# Patient Record
Sex: Female | Born: 1970 | Race: Black or African American | Hispanic: No | Marital: Single | Smoking: Current every day smoker
Health system: Southern US, Community
[De-identification: ages and names within clinical notes are randomized; demographics above are authoritative.]

## PROBLEM LIST (undated history)

## (undated) DIAGNOSIS — I10 Essential (primary) hypertension: Secondary | ICD-10-CM

## (undated) DIAGNOSIS — F329 Major depressive disorder, single episode, unspecified: Secondary | ICD-10-CM

## (undated) DIAGNOSIS — F32A Depression, unspecified: Secondary | ICD-10-CM

## (undated) HISTORY — PX: TUBAL LIGATION: SHX77

---

## 2004-06-28 ENCOUNTER — Emergency Department: Payer: Self-pay | Admitting: Emergency Medicine

## 2004-07-02 ENCOUNTER — Inpatient Hospital Stay: Payer: Self-pay | Admitting: Internal Medicine

## 2008-10-25 ENCOUNTER — Inpatient Hospital Stay: Payer: Self-pay | Admitting: Obstetrics and Gynecology

## 2009-05-08 ENCOUNTER — Emergency Department: Payer: Self-pay | Admitting: Unknown Physician Specialty

## 2015-12-16 ENCOUNTER — Emergency Department
Admission: EM | Admit: 2015-12-16 | Discharge: 2015-12-16 | Disposition: A | Discharge status: Home / Self Care | Payer: Self-pay | Attending: Emergency Medicine | Admitting: Emergency Medicine

## 2015-12-16 ENCOUNTER — Encounter: Payer: Self-pay | Admitting: Emergency Medicine

## 2015-12-16 ENCOUNTER — Emergency Department: Payer: Self-pay

## 2015-12-16 DIAGNOSIS — F17210 Nicotine dependence, cigarettes, uncomplicated: Secondary | ICD-10-CM | POA: Insufficient documentation

## 2015-12-16 DIAGNOSIS — R079 Chest pain, unspecified: Secondary | ICD-10-CM | POA: Insufficient documentation

## 2015-12-16 DIAGNOSIS — E119 Type 2 diabetes mellitus without complications: Secondary | ICD-10-CM | POA: Insufficient documentation

## 2015-12-16 HISTORY — DX: Depression, unspecified: F32.A

## 2015-12-16 HISTORY — DX: Major depressive disorder, single episode, unspecified: F32.9

## 2015-12-16 LAB — CBC
HEMATOCRIT: 37.4 % (ref 35.0–47.0)
Hemoglobin: 12.8 g/dL (ref 12.0–16.0)
MCH: 35 pg — ABNORMAL HIGH (ref 26.0–34.0)
MCHC: 34.1 g/dL (ref 32.0–36.0)
MCV: 102.9 fL — AB (ref 80.0–100.0)
Platelets: 187 10*3/uL (ref 150–440)
RBC: 3.64 MIL/uL — ABNORMAL LOW (ref 3.80–5.20)
RDW: 13.9 % (ref 11.5–14.5)
WBC: 7 10*3/uL (ref 3.6–11.0)

## 2015-12-16 LAB — COMPREHENSIVE METABOLIC PANEL
ALBUMIN: 4.4 g/dL (ref 3.5–5.0)
ALT: 14 U/L (ref 14–54)
AST: 31 U/L (ref 15–41)
Alkaline Phosphatase: 70 U/L (ref 38–126)
Anion gap: 9 (ref 5–15)
BUN: 13 mg/dL (ref 6–20)
CO2: 20 mmol/L — ABNORMAL LOW (ref 22–32)
CREATININE: 0.56 mg/dL (ref 0.44–1.00)
Calcium: 9.3 mg/dL (ref 8.9–10.3)
Chloride: 108 mmol/L (ref 101–111)
GFR calc Af Amer: >60 mL/min (ref >60)
GLUCOSE: 116 mg/dL — AB (ref 65–99)
Potassium: 3.7 mmol/L (ref 3.5–5.1)
Sodium: 137 mmol/L (ref 135–145)
TOTAL PROTEIN: 8.4 g/dL — AB (ref 6.5–8.1)

## 2015-12-16 LAB — TROPONIN I: Troponin I: <0.03 ng/mL (ref <0.03)

## 2015-12-16 NOTE — ED Notes (Signed)
Pt verbalized understanding of discharge instructions. NAD at this time. 
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
## 2015-12-16 NOTE — ED Provider Notes (Signed)
I accepted care from Dr. Cyril LoosenKinner. Patient presented with atypical chest discomfort, and initial workup and evaluation reassuring. Sign out follow-up repeat troponin.  8:30, I reviewed the second negative troponin. I discussed with patient and discussed return precautions and follow-up plan. Follow-up to include calling cardiology office for appointment in the next 1-2 days. Also follow up with primary care doctor within 1 week.     Governor Rooksebecca Aubreigh Fuerte, MD 12/16/15 [phone]
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
## 2015-12-16 NOTE — Discharge Instructions (Signed)
You were evaluated for chest discomfort, although no certain cause was found, your exam and evaluation are reassuring in the emergency department today.  Return to the emergency department for any worsening chest pain, fever, nausea, sweats, weakness, numbness, dizziness or passing out, altered mental status, or any other symptoms concerning to you.  I recommend he follow up with your primary doctor in 1 week. I recommend you follow up with a cardiologist, call the office number provided tomorrow for an appointment within the next 1-2 days for further evaluation.

## 2016-02-27 ENCOUNTER — Emergency Department
Admission: EM | Admit: 2016-02-27 | Discharge: 2016-02-27 | Disposition: A | Discharge status: Home / Self Care | Payer: Self-pay | Attending: Emergency Medicine | Admitting: Emergency Medicine

## 2016-02-27 ENCOUNTER — Emergency Department: Payer: Self-pay

## 2016-02-27 DIAGNOSIS — R609 Edema, unspecified: Secondary | ICD-10-CM

## 2016-02-27 DIAGNOSIS — M25569 Pain in unspecified knee: Secondary | ICD-10-CM

## 2016-02-27 DIAGNOSIS — M1712 Unilateral primary osteoarthritis, left knee: Secondary | ICD-10-CM | POA: Insufficient documentation

## 2016-02-27 DIAGNOSIS — F17210 Nicotine dependence, cigarettes, uncomplicated: Secondary | ICD-10-CM | POA: Insufficient documentation

## 2016-02-27 DIAGNOSIS — M25462 Effusion, left knee: Secondary | ICD-10-CM

## 2016-02-27 MED ORDER — HYDROCODONE-ACETAMINOPHEN 5-325 MG PO TABS
1.0000 | ORAL_TABLET | ORAL | Status: AC
  Administered 2016-02-27: 1 via ORAL
  Filled 2016-02-27: qty 1

## 2016-02-27 MED ORDER — LIDOCAINE HCL (PF) 1 % IJ SOLN
5.0000 mL | Freq: Once | INTRAMUSCULAR | Status: AC
  Administered 2016-02-27: 5 mL
  Filled 2016-02-27: qty 5

## 2016-02-27 NOTE — ED Provider Notes (Signed)
[facility]-EMERGENCY DEPARTMENT Provider Note   CSN: [id number] Arrival date & time: 02/27/16  1617     History   Chief Complaint Chief Complaint  Patient presents with  . Knee Pain    HPI Adriana Meza is a 46 y.o. female presents to the emergency department for evaluation of left knee pain and swelling. She has had tightness and swelling throughout the left knee for the last few days. She denies any trauma or injury. She's had knee pain off and on for the last 5 years. No trauma or injury. She has not had any treatment for her knee pain, states she has had x-rays showing osteoarthritis in the past. Her knee will usually take at nighttime and with cold rainy weather. Her last few days she's had significant tightness and swelling throughout the left knee that has limited her ability to walk. Pain is severe. She has not taken any medications for pain. She denies any coughing, chest pain, shortness of breath, edema or redness throughout the lower extremity.  HPI  Past Medical History:  Diagnosis Date  . Depression     There are no active problems to display for this patient.   Past Surgical History:  Procedure Laterality Date  . CESAREAN SECTION    . TUBAL LIGATION      OB History    No data available       Home Medications    Prior to Admission medications   Not on File    Family History No family history on file.  Social History Social History  Substance Use Topics  . Smoking status: Current Every Day Smoker    Packs/day: 0.50    Types: Cigarettes  . Smokeless tobacco: Never Used  . Alcohol use No     Allergies   Patient has no known allergies.   Review of Systems Review of Systems  Constitutional: Negative for activity change, chills, fatigue and fever.  HENT: Negative for congestion, sinus pressure and sore throat.   Eyes: Negative for visual disturbance.  Respiratory: Negative for cough, chest tightness and shortness of breath.     Cardiovascular: Negative for chest pain and leg swelling.  Gastrointestinal: Negative for abdominal pain, diarrhea, nausea and vomiting.  Genitourinary: Negative for dysuria.  Musculoskeletal: Positive for joint swelling. Negative for arthralgias and gait problem.  Skin: Negative for rash.  Neurological: Negative for weakness, numbness and headaches.  Hematological: Negative for adenopathy.  Psychiatric/Behavioral: Negative for agitation, behavioral problems and confusion.     Physical Exam Updated Vital Signs BP (!) 129/92 (BP Location: Left Arm)   Pulse 98   Temp 97.6 F (36.4 C) (Oral)   Resp 18   Wt 61.2 kg   SpO2 98%   BMI 24.69 kg/m   Physical Exam  Constitutional: She is oriented to person, place, and time. She appears well-developed and well-nourished. No distress.  HENT:  Head: Normocephalic and atraumatic.  Mouth/Throat: Oropharynx is clear and moist.  Eyes: EOM are normal. Pupils are equal, round, and reactive to light. Right eye exhibits no discharge. Left eye exhibits no discharge.  Neck: Normal range of motion. Neck supple.  Cardiovascular: Normal rate, regular rhythm and intact distal pulses.   Pulmonary/Chest: Effort normal and breath sounds normal. No respiratory distress. She exhibits no tenderness.  Abdominal: Soft. She exhibits no distension. There is no tenderness.  Musculoskeletal: Normal range of motion. She exhibits no edema.  Left Lower Extremities: Examination of the left lower extremity reveals no bony abnormality, no  edema, moderate effusion and no ecchymosis. No warmth or redness. There is no valgus or varus abnormality.  The patient is non-tender along the lateral joint line, and is non-tender along the medial joint line.  The patient has limited flexion of the knee to 90. Patient is able to straight leg raise. There is moderate discomfort with hyperflexion. There is no retropatellar discomfort.  The patient has a negative patella stretch test.  The  patient has a negative varus stress test and a negative valgus stress test, in looking for stability.  The patient has a negative Lachman's test.  Vascular: The patient has a negative Denna HaggardHomans' test bilaterally.  The patient had a normal dorsalis pedis and posterior tibial pulse.  There is normal skin warmth.  There is normal capillary refill bilaterally.    Neurologic: The patient has a negative straight leg raise.  The patient has normal muscle strength testing for the quadriceps, calves, ankle dorsiflexion, ankle plantarflexion, and extensor hallicus longus.  The patient has sensation that is intact to light touch.    Neurological: She is alert and oriented to person, place, and time. She has normal reflexes.  Skin: Skin is warm and dry.  Psychiatric: She has a normal mood and affect. Her behavior is normal. Thought content normal.     ED Treatments / Results  Labs (all labs ordered are listed, but only abnormal results are displayed) Labs Reviewed - No data to display  EKG  EKG Interpretation None       Radiology Dg Knee Complete 4 Views Left  Result Date: 02/27/2016 CLINICAL DATA:  Recurrent left knee pain and swelling. No known injury. EXAM: LEFT KNEE - COMPLETE 4+ VIEW COMPARISON:  None. FINDINGS: No evidence of acute fracture or dislocation. A moderate knee joint effusion is seen. Moderate tricompartmental osteoarthritis is demonstrated. No focal lytic or sclerotic bone lesions identified. IMPRESSION: Moderate tricompartmental osteoarthritis with knee joint effusion. Electronically Signed   By: Myles RosenthalJohn  Stahl M.D.   On: 02/27/2016 17:39    Procedures Procedures (including critical care time) Knee aspiration left: Left knee was prepped in the superior lateral patellar position with Betadine and alcohol. Patient was injected with 3 cc 1% lidocaine for anesthetic affect. Skin was cleansed again with chlorhexidine, A 16-gauge needle was inserted into the knee and aspirated with a 60  cc syringe. 45 cc of normal synovial full was aspirated from the knee. Ace wrap was applied by myself.  Medications Ordered in ED Medications  lidocaine (PF) (XYLOCAINE) 1 % injection 5 mL (5 mLs Infiltration Given 02/27/16 1704)  HYDROcodone-acetaminophen (NORCO/VICODIN) 5-325 MG per tablet 1 tablet (1 tablet Oral Given 02/27/16 1704)     Initial Impression / Assessment and Plan / ED Course  I have reviewed the triage vital signs and the nursing notes.  Pertinent labs & imaging results that were available during my care of the patient were reviewed by me and considered in my medical decision making (see chart for details).     46 year old female with left knee effusion secondary to osteoarthritis. No evidence of acute bony abnormality. Aspiration was performed in the emergency department. Patient tolerated procedure well and saw improvement of pain. She will take ibuprofen and Tylenol for pain. Ace wrap is applied. She'll follow-up with orthopedics.  Final Clinical Impressions(s) / ED Diagnoses   Final diagnoses:  Swelling  Knee pain, acute  Effusion of left knee  Primary osteoarthritis of left knee    New Prescriptions New Prescriptions   No medications  on file     Evon Slack, PA-C 02/27/16 1816    Governor Rooks, MD 02/27/16 2050

## 2016-02-27 NOTE — ED Triage Notes (Signed)
Pt reports recurrent left knee swelling. Pt denies injury. Noticeable swelling to interior left knee. Pt able to walk but it causes pain. Pt alert and oriented X4, active, cooperative, pt in NAD. RR even and unlabored, color WNL.

## 2016-02-27 NOTE — Discharge Instructions (Signed)
Please continue with Ace wrap during the day removed at nighttime. Apply ice to the knee 20 minutes every hour. Take Tylenol and/or ibuprofen as needed for pain. Follow-up with orthopedics in 5-7 days.

## 2017-01-02 ENCOUNTER — Encounter: Payer: Self-pay | Admitting: Emergency Medicine

## 2017-01-02 ENCOUNTER — Emergency Department
Admission: EM | Admit: 2017-01-02 | Discharge: 2017-01-02 | Disposition: A | Discharge status: Home / Self Care | Payer: Self-pay | Attending: Emergency Medicine | Admitting: Emergency Medicine

## 2017-01-02 ENCOUNTER — Other Ambulatory Visit: Payer: Self-pay

## 2017-01-02 DIAGNOSIS — M2540 Effusion, unspecified joint: Secondary | ICD-10-CM

## 2017-01-02 DIAGNOSIS — M25462 Effusion, left knee: Secondary | ICD-10-CM | POA: Insufficient documentation

## 2017-01-02 DIAGNOSIS — F17210 Nicotine dependence, cigarettes, uncomplicated: Secondary | ICD-10-CM | POA: Insufficient documentation

## 2017-01-02 MED ORDER — KETOROLAC TROMETHAMINE 30 MG/ML IJ SOLN
30.0000 mg | Freq: Once | INTRAMUSCULAR | Status: AC
  Administered 2017-01-02: 30 mg via INTRAMUSCULAR
  Filled 2017-01-02: qty 1

## 2017-01-02 MED ORDER — IBUPROFEN 800 MG PO TABS: 800.0000 mg | ORAL_TABLET | Freq: Three times a day (TID) | ORAL | 0 refills | Status: AC | PRN

## 2017-01-02 NOTE — ED Triage Notes (Signed)
Pt c/o left knee pain and swelling. Started 2 days ago. Hx fluid on knee 2 years ago. No injury. No redness noted.

## 2017-01-02 NOTE — ED Provider Notes (Signed)
[facility] Emergency Department Provider Note  ____________________________________________  Time seen: Approximately 11:55 AM  I have reviewed the triage vital signs and the nursing notes.   HISTORY  Chief Complaint Knee Pain    HPI Adriana Meza is a 46 y.o. female that presents to the emergency department for evaluation of left knee pain and swelling for 2-3 days.  She is able to fully move her knee with pain.  Knee is not painful to touch.  She has been walking on knee normally.  She states that this happened 2 years ago and needed to have fluid drained off of her knee.  No recent injury.  No alleviating measures have been attempted.  No history of gout.  She denies fever, chills, calf pain, numbness, tingling.  Past Medical History:  Diagnosis Date  . Depression     There are no active problems to display for this patient.   Past Surgical History:  Procedure Laterality Date  . CESAREAN SECTION    . TUBAL LIGATION      Prior to Admission medications   Medication Sig Start Date End Date Taking? Authorizing Provider  ibuprofen (ADVIL,MOTRIN) 800 MG tablet Take 1 tablet (800 mg total) by mouth every 8 (eight) hours as needed. 01/02/17   Enid DerryWagner, Daquarius Dubeau, PA-C    Allergies Patient has no known allergies.  History reviewed. No pertinent family history.  Social History Social History   Tobacco Use  . Smoking status: Current Every Day Smoker    Packs/day: 0.50    Types: Cigarettes  . Smokeless tobacco: Never Used  Substance Use Topics  . Alcohol use: No  . Drug use: No     Review of Systems  Constitutional: No fever/chills Cardiovascular: No chest pain. Respiratory:  No SOB. Gastrointestinal: No abdominal pain.  No nausea, no vomiting.  Musculoskeletal: Positive for knee pain. Skin: Negative for rash, abrasions, lacerations, ecchymosis. Neurological: Negative for headaches, numbness or  tingling   ____________________________________________   PHYSICAL EXAM:  VITAL SIGNS: ED Triage Vitals  Enc Vitals Group     BP 01/02/17 1044 (!) 153/101     Pulse Rate 01/02/17 1044 97     Resp 01/02/17 1044 13     Temp 01/02/17 1044 98.4 F (36.9 C)     Temp Source 01/02/17 1044 Oral     SpO2 01/02/17 1044 98 %     Weight 01/02/17 1045 124 lb (56.2 kg)     Height 01/02/17 1045 5\' 3"  (1.6 m)     Head Circumference --      Peak Flow --      Pain Score 01/02/17 1050 9     Pain Loc --      Pain Edu? --      Excl. in GC? --      Constitutional: Alert and oriented. Well appearing and in no acute distress. Eyes: Conjunctivae are normal. PERRL. EOMI. Head: Atraumatic. ENT:      Ears:      Nose: No congestion/rhinnorhea.      Mouth/Throat: Mucous membranes are moist.  Neck: No stridor.  Cardiovascular: Normal rate, regular rhythm.  Good peripheral circulation.  Symmetric dorsalis pedis pulses bilaterally. Respiratory: Normal respiratory effort without tachypnea or retractions. Lungs CTAB. Good air entry to the bases with no decreased or absent breath sounds. Musculoskeletal: Full range of motion to all extremities. No gross deformities appreciated.  Full range of motion of left knee.  No erythema or warmth to knee.  Mild  swelling to superior portion of knee.  No tenderness to palpation in popliteal fossa or calf. Neurologic:  Normal speech and language. No gross focal neurologic deficits are appreciated.  Skin:  Skin is warm, dry and intact. No rash noted.   ____________________________________________   LABS (all labs ordered are listed, but only abnormal results are displayed)  Labs Reviewed - No data to display ____________________________________________  EKG   ____________________________________________  RADIOLOGY   No results found.  ____________________________________________    PROCEDURES  Procedure(s) performed:     Procedures    Medications  ketorolac (TORADOL) 30 MG/ML injection 30 mg (30 mg Intramuscular Given 01/02/17 1158)     ____________________________________________   INITIAL IMPRESSION / ASSESSMENT AND PLAN / ED COURSE  Pertinent labs & imaging results that were available during my care of the patient were reviewed by me and considered in my medical decision making (see chart for details).  Review of the [place] CSRS was performed in accordance of the [facility] prior to dispensing any controlled drugs.   Patient's diagnosis is consistent with joint effusion.  Vital signs and exam are reassuring.  No trauma to knee.  Patient has full range of motion of knee, is walking on knee, has no erythema or warmth so this is unlikely gout or septic joint.  She was given IM Toradol.  Knee was ace wrapped and crutches were given.  Patient was walking around the ED without her crutches and she was educated to use the crutches.  Patient will be discharged home with prescriptions for ibuprofen. Patient is to follow up with orthopedics for joint fluid removal. Patient is given ED precautions to return to the ED for any worsening or new symptoms.     ____________________________________________  FINAL CLINICAL IMPRESSION(S) / ED DIAGNOSES  Final diagnoses:  Joint effusion      NEW MEDICATIONS STARTED DURING THIS VISIT:  ED Discharge Orders        Ordered    ibuprofen (ADVIL,MOTRIN) 800 MG tablet  Every 8 hours PRN     01/02/17 1215          This chart was dictated using voice recognition software/Dragon. Despite best efforts to proofread, errors can occur which can change the meaning. Any change was purely unintentional.    Enid DerryWagner, Rodrigo Mcgranahan, PA-C 01/02/17 1239    Arnaldo NatalMalinda, Paul F, MD 01/02/17 1257

## 2017-01-02 NOTE — ED Notes (Signed)
See triage note  Presents with swelling to left knee w/o injury for the past 2 days  Denies any injury  Ambulates slowly d/t pain

## 2017-04-18 ENCOUNTER — Encounter: Payer: Self-pay | Admitting: Medical Oncology

## 2017-04-18 ENCOUNTER — Emergency Department
Admission: EM | Admit: 2017-04-18 | Discharge: 2017-04-18 | Disposition: A | Discharge status: Home / Self Care | Payer: Self-pay | Attending: Emergency Medicine | Admitting: Emergency Medicine

## 2017-04-18 DIAGNOSIS — Z79899 Other long term (current) drug therapy: Secondary | ICD-10-CM | POA: Insufficient documentation

## 2017-04-18 DIAGNOSIS — R945 Abnormal results of liver function studies: Secondary | ICD-10-CM | POA: Insufficient documentation

## 2017-04-18 DIAGNOSIS — F17210 Nicotine dependence, cigarettes, uncomplicated: Secondary | ICD-10-CM | POA: Insufficient documentation

## 2017-04-18 DIAGNOSIS — R7989 Other specified abnormal findings of blood chemistry: Secondary | ICD-10-CM

## 2017-04-18 LAB — CBC WITH DIFFERENTIAL/PLATELET
BASOS ABS: 0 10*3/uL (ref 0–0.1)
Basophils Relative: 1 %
EOS PCT: 0 %
Eosinophils Absolute: 0 10*3/uL (ref 0–0.7)
HCT: 35.6 % (ref 35.0–47.0)
Hemoglobin: 12.3 g/dL (ref 12.0–16.0)
LYMPHS PCT: 21 %
Lymphs Abs: 1.4 10*3/uL (ref 1.0–3.6)
MCH: 36.1 pg — ABNORMAL HIGH (ref 26.0–34.0)
MCHC: 34.5 g/dL (ref 32.0–36.0)
MCV: 104.6 fL — AB (ref 80.0–100.0)
MONO ABS: 0.3 10*3/uL (ref 0.2–0.9)
MONOS PCT: 4 %
Neutro Abs: 4.9 10*3/uL (ref 1.4–6.5)
Neutrophils Relative %: 74 %
Platelets: 174 10*3/uL (ref 150–440)
RBC: 3.4 MIL/uL — ABNORMAL LOW (ref 3.80–5.20)
RDW: 14.8 % — AB (ref 11.5–14.5)
WBC: 6.7 10*3/uL (ref 3.6–11.0)

## 2017-04-18 LAB — COMPREHENSIVE METABOLIC PANEL
ALT: 26 U/L (ref 14–54)
ANION GAP: 11 (ref 5–15)
AST: 61 U/L — AB (ref 15–41)
Albumin: 4.5 g/dL (ref 3.5–5.0)
Alkaline Phosphatase: 67 U/L (ref 38–126)
BUN: 15 mg/dL (ref 6–20)
CHLORIDE: 106 mmol/L (ref 101–111)
CO2: 20 mmol/L — ABNORMAL LOW (ref 22–32)
Calcium: 8.9 mg/dL (ref 8.9–10.3)
Creatinine, Ser: 0.68 mg/dL (ref 0.44–1.00)
GFR calc Af Amer: >60 mL/min (ref >60)
Glucose, Bld: 161 mg/dL — ABNORMAL HIGH (ref 65–99)
POTASSIUM: 3.2 mmol/L — AB (ref 3.5–5.1)
Sodium: 137 mmol/L (ref 135–145)
TOTAL PROTEIN: 8.3 g/dL — AB (ref 6.5–8.1)
Total Bilirubin: 0.4 mg/dL (ref 0.3–1.2)

## 2017-04-18 LAB — PROTIME-INR
INR: 0.87
Prothrombin Time: 11.8 seconds (ref 11.4–15.2)

## 2017-04-18 NOTE — ED Provider Notes (Signed)
[facility] Emergency Department Provider Note ___________________________________________   First MD Initiated Contact with Patient 04/18/17 1016     (approximate)  I have reviewed the triage vital signs and the nursing notes.   HISTORY  Chief Complaint Bleeding/Bruising  HPI Adriana Meza is a 47 y.o. female who presents to the emergency department for treatment and evaluation of bruising. She has noticed increase in unexplained bruising for the past couple of weeks. She denies frequent use of NSAIDs, ASA, blood thinners. She admits to ETOH, but "not like that."    Past Medical History:  Diagnosis Date  . Depression     There are no active problems to display for this patient.   Past Surgical History:  Procedure Laterality Date  . CESAREAN SECTION    . TUBAL LIGATION      Prior to Admission medications   Medication Sig Start Date End Date Taking? Authorizing Provider  ibuprofen (ADVIL,MOTRIN) 800 MG tablet Take 1 tablet (800 mg total) by mouth every 8 (eight) hours as needed. 01/02/17   Enid DerryWagner, Ashley, PA-C    Allergies Patient has no known allergies.  No family history on file.  Social History Social History   Tobacco Use  . Smoking status: Current Every Day Smoker    Packs/day: 0.50    Types: Cigarettes  . Smokeless tobacco: Never Used  Substance Use Topics  . Alcohol use: No  . Drug use: No    Review of Systems  Constitutional: No fever/chills Eyes: No visual changes. ENT: No sore throat. Cardiovascular: Denies chest pain. Respiratory: Denies shortness of breath. Gastrointestinal: No abdominal pain. No hematemesis. No rectal bleeding. Genitourinary: Negative for dysuria. Negative for hematuria. Musculoskeletal: Negative for back pain. Skin: Negative for rash. Neurological: Negative for headaches, focal weakness or numbness. ____________________________________________   PHYSICAL EXAM:  VITAL SIGNS: ED Triage  Vitals [04/18/17 0931]  Enc Vitals Group     BP (!) 163/105     Pulse Rate 100     Resp 18     Temp 98 F (36.7 C)     Temp Source Oral     SpO2 100 %     Weight 119 lb (54 kg)     Height 5\' 4"  (1.626 m)     Head Circumference      Peak Flow      Pain Score 0     Pain Loc      Pain Edu?      Excl. in GC?     Constitutional: Alert and oriented. Well appearing and in no acute distress. Eyes: Conjunctivae are normal. PERRL. EOMI.  Head: Atraumatic. Nose: No congestion/rhinnorhea. Mouth/Throat: Mucous membranes are moist.  Oropharynx non-erythematous. Neck: No stridor.   Cardiovascular: Normal rate, regular rhythm. Grossly normal heart sounds.  Good peripheral circulation. Respiratory: Normal respiratory effort.  No retractions. Lungs CTAB. Gastrointestinal: Soft and nontender. No distention. No abdominal bruits. No CVA tenderness. Musculoskeletal: No lower extremity tenderness nor edema.  No joint effusions. Neurologic:  Normal speech and language. No gross focal neurologic deficits are appreciated. No gait instability. Skin:  Skin is warm, dry and intact. No rash noted. Psychiatric: Mood and affect are normal. Speech and behavior are normal.  ____________________________________________   LABS (all labs ordered are listed, but only abnormal results are displayed)  Labs Reviewed  CBC WITH DIFFERENTIAL/PLATELET - Abnormal; Notable for the following components:      Result Value   RBC 3.40 (*)    MCV 104.6 (*)  MCH 36.1 (*)    RDW 14.8 (*)    All other components within normal limits  COMPREHENSIVE METABOLIC PANEL - Abnormal; Notable for the following components:   Potassium 3.2 (*)    CO2 20 (*)    Glucose, Bld 161 (*)    Total Protein 8.3 (*)    AST 61 (*)    All other components within normal limits  PROTIME-INR  HEPATITIS PANEL, ACUTE   ____________________________________________  EKG  Not  indicated. ____________________________________________  RADIOLOGY  ED MD interpretation:  Not indicated  Official radiology report(s): No results found.  ____________________________________________   PROCEDURES  Procedure(s) performed: None  Procedures  Critical Care performed: No  ____________________________________________   INITIAL IMPRESSION / ASSESSMENT AND PLAN / ED COURSE  As part of my medical decision making, I reviewed the following data within the electronic MEDICAL RECORD NUMBER   47 year old female presenting to the emergency department for treatment and evaluation of unexplained bruising.  Patient admits to heavy alcohol use, but did not think it was the cause of her bruising.  AST is elevated.  Patient was encouraged to seek help for her alcohol use if needed or stop alcohol in general.  She was also advised to avoid NSAIDs.  She was encouraged to call and schedule an appointment with [facility].  He was also advised to return to the emergency department for symptoms of change or worsen if she is unable to schedule appointment.  Hepatitis panel is pending and is a send out lab study. ____________________________________________   FINAL CLINICAL IMPRESSION(S) / ED DIAGNOSES  Final diagnoses:  Elevated liver function tests     ED Discharge Orders    None       Note:  This document was prepared using Dragon voice recognition software and may include unintentional dictation errors.    Chinita Pester, FNP 04/18/17 1148    Darci Current, MD 04/18/17 1321

## 2017-04-18 NOTE — Discharge Instructions (Signed)
Please avoid alcohol and NSAIDs. Follow up with the primary care provider as soon as possible. Return to the ER for symptoms that change or worsen or for new concerns if you are unable to schedule an appointment.

## 2017-04-18 NOTE — ED Triage Notes (Signed)
Pt reports bruising to upper arm and bilt legs without known injury.

## 2017-04-18 NOTE — ED Notes (Signed)
See triage note  Presents with bruising noted to upper arms and thighs for about 1 week    Denies any injury  Does state that area is painful

## 2017-04-20 LAB — HEPATITIS PANEL, ACUTE
Hep A IgM: NEGATIVE
Hep B C IgM: NEGATIVE
Hepatitis B Surface Ag: NEGATIVE

## 2017-05-01 ENCOUNTER — Other Ambulatory Visit: Payer: Self-pay

## 2017-05-01 ENCOUNTER — Emergency Department
Admission: EM | Admit: 2017-05-01 | Discharge: 2017-05-01 | Disposition: A | Discharge status: Home / Self Care | Payer: Self-pay | Attending: Emergency Medicine | Admitting: Emergency Medicine

## 2017-05-01 ENCOUNTER — Encounter: Payer: Self-pay | Admitting: Emergency Medicine

## 2017-05-01 ENCOUNTER — Emergency Department: Payer: Self-pay

## 2017-05-01 DIAGNOSIS — R110 Nausea: Secondary | ICD-10-CM | POA: Insufficient documentation

## 2017-05-01 DIAGNOSIS — R52 Pain, unspecified: Secondary | ICD-10-CM

## 2017-05-01 DIAGNOSIS — Z79899 Other long term (current) drug therapy: Secondary | ICD-10-CM | POA: Insufficient documentation

## 2017-05-01 DIAGNOSIS — F1010 Alcohol abuse, uncomplicated: Secondary | ICD-10-CM | POA: Insufficient documentation

## 2017-05-01 DIAGNOSIS — R1013 Epigastric pain: Secondary | ICD-10-CM | POA: Insufficient documentation

## 2017-05-01 LAB — URINALYSIS, COMPLETE (UACMP) WITH MICROSCOPIC
Bacteria, UA: NONE SEEN
Bilirubin Urine: NEGATIVE
GLUCOSE, UA: NEGATIVE mg/dL
HGB URINE DIPSTICK: NEGATIVE
Ketones, ur: NEGATIVE mg/dL
LEUKOCYTES UA: NEGATIVE
NITRITE: NEGATIVE
PROTEIN: 30 mg/dL — AB
Specific Gravity, Urine: 1.006 (ref 1.005–1.030)
pH: 8 (ref 5.0–8.0)

## 2017-05-01 LAB — COMPREHENSIVE METABOLIC PANEL
ALK PHOS: 89 U/L (ref 38–126)
ALT: 26 U/L (ref 14–54)
ANION GAP: 7 (ref 5–15)
AST: 31 U/L (ref 15–41)
Albumin: 4.3 g/dL (ref 3.5–5.0)
BUN: 7 mg/dL (ref 6–20)
CALCIUM: 9.5 mg/dL (ref 8.9–10.3)
CO2: 27 mmol/L (ref 22–32)
CREATININE: 0.63 mg/dL (ref 0.44–1.00)
Chloride: 103 mmol/L (ref 101–111)
Glucose, Bld: 120 mg/dL — ABNORMAL HIGH (ref 65–99)
Potassium: 3.3 mmol/L — ABNORMAL LOW (ref 3.5–5.1)
Sodium: 137 mmol/L (ref 135–145)
Total Bilirubin: 0.4 mg/dL (ref 0.3–1.2)
Total Protein: 8.4 g/dL — ABNORMAL HIGH (ref 6.5–8.1)

## 2017-05-01 LAB — ETHANOL

## 2017-05-01 LAB — CBC WITH DIFFERENTIAL/PLATELET
BASOS ABS: 0.1 10*3/uL (ref 0–0.1)
BASOS PCT: 1 %
EOS ABS: 0.1 10*3/uL (ref 0–0.7)
EOS PCT: 1 %
HCT: 34.2 % — ABNORMAL LOW (ref 35.0–47.0)
Hemoglobin: 11.7 g/dL — ABNORMAL LOW (ref 12.0–16.0)
Lymphocytes Relative: 26 %
Lymphs Abs: 2.6 10*3/uL (ref 1.0–3.6)
MCH: 35.9 pg — ABNORMAL HIGH (ref 26.0–34.0)
MCHC: 34.2 g/dL (ref 32.0–36.0)
MCV: 104.9 fL — ABNORMAL HIGH (ref 80.0–100.0)
MONO ABS: 0.9 10*3/uL (ref 0.2–0.9)
Monocytes Relative: 10 %
Neutro Abs: 6.1 10*3/uL (ref 1.4–6.5)
Neutrophils Relative %: 62 %
PLATELETS: 225 10*3/uL (ref 150–440)
RBC: 3.26 MIL/uL — ABNORMAL LOW (ref 3.80–5.20)
RDW: 14.3 % (ref 11.5–14.5)
WBC: 9.8 10*3/uL (ref 3.6–11.0)

## 2017-05-01 LAB — LIPASE, BLOOD: LIPASE: 42 U/L (ref 11–51)

## 2017-05-01 LAB — HCG, QUANTITATIVE, PREGNANCY: hCG, Beta Chain, Quant, S: 2 m[IU]/mL (ref <5)

## 2017-05-01 LAB — PROTIME-INR
INR: 0.89
Prothrombin Time: 12 seconds (ref 11.4–15.2)

## 2017-05-01 MED ORDER — GI COCKTAIL ~~LOC~~
30.0000 mL | Freq: Once | ORAL | Status: AC
  Administered 2017-05-01: 30 mL via ORAL
  Filled 2017-05-01: qty 30

## 2017-05-01 MED ORDER — PANTOPRAZOLE SODIUM 40 MG PO TBEC: 40.0000 mg | DELAYED_RELEASE_TABLET | Freq: Every day | ORAL | 0 refills | Status: AC

## 2017-05-01 MED ORDER — KETOROLAC TROMETHAMINE 30 MG/ML IJ SOLN
15.0000 mg | Freq: Once | INTRAMUSCULAR | Status: AC
  Administered 2017-05-01: 15 mg via INTRAVENOUS
  Filled 2017-05-01: qty 1

## 2017-05-01 MED ORDER — ONDANSETRON HCL 4 MG/2ML IJ SOLN
INTRAMUSCULAR | Status: AC
  Filled 2017-05-01: qty 2

## 2017-05-01 MED ORDER — FAMOTIDINE 20 MG PO TABS: 20.0000 mg | ORAL_TABLET | Freq: Two times a day (BID) | ORAL | 0 refills | Status: AC

## 2017-05-01 MED ORDER — FAMOTIDINE 20 MG PO TABS
40.0000 mg | ORAL_TABLET | Freq: Once | ORAL | Status: AC
  Administered 2017-05-01: 40 mg via ORAL

## 2017-05-01 MED ORDER — ONDANSETRON HCL 4 MG/2ML IJ SOLN
4.0000 mg | Freq: Once | INTRAMUSCULAR | Status: AC
  Administered 2017-05-01: 4 mg via INTRAVENOUS

## 2017-05-01 MED ORDER — PANTOPRAZOLE SODIUM 40 MG PO TBEC
40.0000 mg | DELAYED_RELEASE_TABLET | Freq: Once | ORAL | Status: AC
  Administered 2017-05-01: 40 mg via ORAL

## 2017-05-01 NOTE — ED Triage Notes (Signed)
Patient complains of abd pain x2 days but has been told she has had liver issues and has an apt to see a GI specialist on May 23rd. Patient denies nausea, vomiting and diarrhea. Pain is intermittent.

## 2017-05-01 NOTE — ED Provider Notes (Signed)
[facility] Emergency Department Provider Note  ____________________________________________   First MD Initiated Contact with Patient 05/01/17 [phone]     (approximate)  I have reviewed the triage vital signs and the nursing notes.   HISTORY  Chief Complaint Abdominal Pain   HPI Adriana Meza is a 47 y.o. female who comes to the emergency department via EMS with epigastric pain.  Her pain is intermittent epigastric nonradiating.  Associated with some nausea.  Her symptoms have persisted for the past several weeks.  She was seen in our emergency department recently and was told that her "liver is not quite right".  She has a follow-up appointment with gastroenterology however this evening her pain worsened and she came for a second opinion.  She drinks alcohol 3-4 nights a week.  She does occasionally take BC powder.  She denies diarrhea.  She has a history of C-section.  Nothing in particular seems to make her symptoms better or worse.  Is not particularly postprandial.  Past Medical History:  Diagnosis Date  . Depression     There are no active problems to display for this patient.   Past Surgical History:  Procedure Laterality Date  . CESAREAN SECTION    . TUBAL LIGATION      Prior to Admission medications   Medication Sig Start Date End Date Taking? Authorizing Provider  famotidine (PEPCID) 20 MG tablet Take 1 tablet (20 mg total) by mouth 2 (two) times daily. 05/01/17 05/01/18  Merrily Brittle, MD  ibuprofen (ADVIL,MOTRIN) 800 MG tablet Take 1 tablet (800 mg total) by mouth every 8 (eight) hours as needed. 01/02/17   Enid Derry, PA-C  pantoprazole (PROTONIX) 40 MG tablet Take 1 tablet (40 mg total) by mouth daily. 05/01/17 05/01/18  Merrily Brittle, MD    Allergies Patient has no known allergies.  No family history on file.  Social History Social History   Tobacco Use  . Smoking status: Current Every Day Smoker    Packs/day: 0.50   Types: Cigarettes  . Smokeless tobacco: Never Used  Substance Use Topics  . Alcohol use: Yes  . Drug use: No    Review of Systems Constitutional: No fever/chills Eyes: No visual changes. ENT: No sore throat. Cardiovascular: Denies chest pain. Respiratory: Denies shortness of breath. Gastrointestinal: Positive for abdominal pain.  Positive for nausea, no vomiting.  No diarrhea.  No constipation. Genitourinary: Negative for dysuria. Musculoskeletal: Negative for back pain. Skin: Negative for rash. Neurological: Negative for headaches, focal weakness or numbness.   ____________________________________________   PHYSICAL EXAM:  VITAL SIGNS: ED Triage Vitals  Enc Vitals Group     BP      Pulse      Resp      Temp      Temp src      SpO2      Weight      Height      Head Circumference      Peak Flow      Pain Score      Pain Loc      Pain Edu?      Excl. in GC?     Constitutional: Alert and oriented x4 tearful and uncomfortable appearing nontoxic no diaphoresis speaks full clear sentences Eyes: PERRL EOMI. Head: Atraumatic. Nose: No congestion/rhinnorhea. Mouth/Throat: No trismus Neck: No stridor.   Cardiovascular: Normal rate, regular rhythm. Grossly normal heart sounds.  Good peripheral circulation. Respiratory: Normal respiratory effort.  No retractions. Lungs CTAB and moving  good air Gastrointestinal: Soft abdomen she does have a liver edge palpable 2 cm below costal margin.  No peritonitis.  negative Murphy's Musculoskeletal: No lower extremity edema   Neurologic:  Normal speech and language. No gross focal neurologic deficits are appreciated. Skin:  Skin is warm, dry and intact. No rash noted. Psychiatric: Mood and affect are normal. Speech and behavior are normal.    ____________________________________________   DIFFERENTIAL includes but not limited to  Biliary colic, cholecystitis, pancreatitis, gastritis, gastric  ulcer ____________________________________________   LABS (all labs ordered are listed, but only abnormal results are displayed)  Labs Reviewed  COMPREHENSIVE METABOLIC PANEL - Abnormal; Notable for the following components:      Result Value   Potassium 3.3 (*)    Glucose, Bld 120 (*)    Total Protein 8.4 (*)    All other components within normal limits  CBC WITH DIFFERENTIAL/PLATELET - Abnormal; Notable for the following components:   RBC 3.26 (*)    Hemoglobin 11.7 (*)    HCT 34.2 (*)    MCV 104.9 (*)    MCH 35.9 (*)    All other components within normal limits  URINALYSIS, COMPLETE (UACMP) WITH MICROSCOPIC - Abnormal; Notable for the following components:   Color, Urine COLORLESS (*)    APPearance CLEAR (*)    Protein, ur 30 (*)    All other components within normal limits  ETHANOL  LIPASE, BLOOD  HCG, QUANTITATIVE, PREGNANCY  PROTIME-INR    Lab work reviewed by me shows elevated MCV concerning for chronic alcohol abuse otherwise largely unremarkable __________________________________________  EKG   ____________________________________________  RADIOLOGY  Right upper quadrant ultrasound reviewed by me with no evidence of cholecystitis ____________________________________________   PROCEDURES  Procedure(s) performed: no  Procedures  Critical Care performed: no  Observation: no ____________________________________________   INITIAL IMPRESSION / ASSESSMENT AND PLAN / ED COURSE  Pertinent labs & imaging results that were available during my care of the patient were reviewed by me and considered in my medical decision making (see chart for details).  On arrival the patient is somewhat uncomfortable appearing with epigastric pain.  Concern for either pancreatitis, gastric disease, versus biliary disease.  We will give a trial of a GI cocktail and check broad labs.  Bedside ultrasound shows a somewhat thickened gallbladder wall so right upper quadrant  ultrasound is pending.  Fortunately the patient's symptoms are improved.  Her ultrasound shows merely a contracted gallbladder.  I have advised the patient to begin taking Pepcid and Protonix and to keep her appointment with GI as scheduled.  Strict return precautions have been given and the patient verbalizes understanding and agreement with the plan.      ____________________________________________   FINAL CLINICAL IMPRESSION(S) / ED DIAGNOSES  Final diagnoses:  Nausea  Epigastric pain  Alcohol abuse      NEW MEDICATIONS STARTED DURING THIS VISIT:  New Prescriptions   FAMOTIDINE (PEPCID) 20 MG TABLET    Take 1 tablet (20 mg total) by mouth 2 (two) times daily.   PANTOPRAZOLE (PROTONIX) 40 MG TABLET    Take 1 tablet (40 mg total) by mouth daily.     Note:  This document was prepared using Dragon voice recognition software and may include unintentional dictation errors.     Merrily Brittleifenbark, Yamato Kopf, MD 05/01/17 [phone]

## 2017-05-01 NOTE — Discharge Instructions (Signed)
Please begin taking both of your antacids as prescribed and keep your follow-up with gastroenterology.  Return to the emergency department for any new or worsening symptoms such as fevers, chills, worsening pain, if you cannot eat or drink, or for any other issues whatsoever.  It was a pleasure to take care of you today, and thank you for coming to our emergency department.  If you have any questions or concerns before leaving please ask the nurse to grab me and I'm more than happy to go through your aftercare instructions again.  If you were prescribed any opioid pain medication today such as Norco, Vicodin, Percocet, morphine, hydrocodone, or oxycodone please make sure you do not drive when you are taking this medication as it can alter your ability to drive safely.  If you have any concerns once you are home that you are not improving or are in fact getting worse before you can make it to your follow-up appointment, please do not hesitate to call 911 and come back for further evaluation.  Adriana BrittleNeil Rainen Vanrossum, MD  Results for orders placed or performed during the hospital encounter of 05/01/17  Comprehensive metabolic panel  Result Value Ref Range   Sodium 137 135 - 145 mmol/L   Potassium 3.3 (L) 3.5 - 5.1 mmol/L   Chloride 103 101 - 111 mmol/L   CO2 27 22 - 32 mmol/L   Glucose, Bld 120 (H) 65 - 99 mg/dL   BUN 7 6 - 20 mg/dL   Creatinine, Ser 1.610.63 0.44 - 1.00 mg/dL   Calcium 9.5 8.9 - 09.610.3 mg/dL   Total Protein 8.4 (H) 6.5 - 8.1 g/dL   Albumin 4.3 3.5 - 5.0 g/dL   AST 31 15 - 41 U/L   ALT 26 14 - 54 U/L   Alkaline Phosphatase 89 38 - 126 U/L   Total Bilirubin 0.4 0.3 - 1.2 mg/dL   GFR calc non Af Amer >60 >60 mL/min   GFR calc Af Amer >60 >60 mL/min   Anion gap 7 5 - 15  Ethanol  Result Value Ref Range   Alcohol, Ethyl (B) <10 <10 mg/dL  Lipase, blood  Result Value Ref Range   Lipase 42 11 - 51 U/L  CBC with Differential  Result Value Ref Range   WBC 9.8 3.6 - 11.0 K/uL   RBC 3.26  (L) 3.80 - 5.20 MIL/uL   Hemoglobin 11.7 (L) 12.0 - 16.0 g/dL   HCT 04.534.2 (L) 40.935.0 - 81.147.0 %   MCV 104.9 (H) 80.0 - 100.0 fL   MCH 35.9 (H) 26.0 - 34.0 pg   MCHC 34.2 32.0 - 36.0 g/dL   RDW 91.414.3 78.211.5 - 95.614.5 %   Platelets 225 150 - 440 K/uL   Neutrophils Relative % 62 %   Neutro Abs 6.1 1.4 - 6.5 K/uL   Lymphocytes Relative 26 %   Lymphs Abs 2.6 1.0 - 3.6 K/uL   Monocytes Relative 10 %   Monocytes Absolute 0.9 0.2 - 0.9 K/uL   Eosinophils Relative 1 %   Eosinophils Absolute 0.1 0 - 0.7 K/uL   Basophils Relative 1 %   Basophils Absolute 0.1 0 - 0.1 K/uL  Urinalysis, Complete w Microscopic  Result Value Ref Range   Color, Urine COLORLESS (A) YELLOW   APPearance CLEAR (A) CLEAR   Specific Gravity, Urine 1.006 1.005 - 1.030   pH 8.0 5.0 - 8.0   Glucose, UA NEGATIVE NEGATIVE mg/dL   Hgb urine dipstick NEGATIVE NEGATIVE  Bilirubin Urine NEGATIVE NEGATIVE   Ketones, ur NEGATIVE NEGATIVE mg/dL   Protein, ur 30 (A) NEGATIVE mg/dL   Nitrite NEGATIVE NEGATIVE   Leukocytes, UA NEGATIVE NEGATIVE   WBC, UA 0-5 0 - 5 WBC/hpf   Bacteria, UA NONE SEEN NONE SEEN   Squamous Epithelial / LPF 0-5 0 - 5  hCG, quantitative, pregnancy  Result Value Ref Range   hCG, Beta Chain, Quant, S 2 <5 mIU/mL  Protime-INR  Result Value Ref Range   Prothrombin Time 12.0 11.4 - 15.2 seconds   INR 0.89    US Abdomen Limited Ruq  Result Date: 05/01/2017 CLINICAL DATA:  Intermittent epigastric pain for 2 days. EXAM: ULTRASOUND ABDOMEN LIMITED RIGHT UPPER QUADRANT COMPARISON:  None. FINDINGS: Gallbladder: Gallbladder is mildly contracted, likely physiologic. No gallstones or wall thickening visualized. No sonographic Murphy sign noted by sonographer. Common bile duct: Diameter: 3 mm, normal Liver: No focal lesion identified. Within normal limits in parenchymal echogenicity. Portal vein is patent on color Doppler imaging with normal direction of blood flow towards the liver. IMPRESSION: Normal examination.  No  evidence of cholelithiasis or cholecystitis. Electronically Signed   By: Burman Nieves M.D.   On: 05/01/2017 06:56

## 2017-12-08 ENCOUNTER — Emergency Department: Payer: Self-pay

## 2017-12-08 ENCOUNTER — Other Ambulatory Visit: Payer: Self-pay

## 2017-12-08 ENCOUNTER — Emergency Department
Admission: EM | Admit: 2017-12-08 | Discharge: 2017-12-08 | Disposition: A | Discharge status: Home / Self Care | Payer: Self-pay | Attending: Emergency Medicine | Admitting: Emergency Medicine

## 2017-12-08 ENCOUNTER — Encounter: Payer: Self-pay | Admitting: Emergency Medicine

## 2017-12-08 DIAGNOSIS — M25562 Pain in left knee: Secondary | ICD-10-CM

## 2017-12-08 DIAGNOSIS — Z79899 Other long term (current) drug therapy: Secondary | ICD-10-CM | POA: Insufficient documentation

## 2017-12-08 DIAGNOSIS — M25462 Effusion, left knee: Secondary | ICD-10-CM | POA: Insufficient documentation

## 2017-12-08 DIAGNOSIS — F17210 Nicotine dependence, cigarettes, uncomplicated: Secondary | ICD-10-CM | POA: Insufficient documentation

## 2017-12-08 MED ORDER — HYDROCODONE-ACETAMINOPHEN 5-325 MG PO TABS: 1.0000 | ORAL_TABLET | Freq: Four times a day (QID) | ORAL | 0 refills | Status: AC | PRN

## 2017-12-08 MED ORDER — KETOROLAC TROMETHAMINE 30 MG/ML IJ SOLN
30.0000 mg | Freq: Once | INTRAMUSCULAR | Status: AC
  Administered 2017-12-08: 30 mg via INTRAMUSCULAR
  Filled 2017-12-08: qty 1

## 2017-12-08 NOTE — Discharge Instructions (Signed)
1.  You may take pain medicine as needed (Norco #15). 2.  You may remove Ace bandage to shower or sleep.  Otherwise, keep it on and elevate your left leg.  Apply ice over Ace wrap several times daily to reduce swelling. 3.  Return to the ER for worsening symptoms, persistent vomiting, difficulty breathing or other concerns.
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
## 2018-09-22 ENCOUNTER — Other Ambulatory Visit: Payer: Self-pay

## 2018-09-22 ENCOUNTER — Emergency Department: Payer: Self-pay

## 2018-09-22 ENCOUNTER — Emergency Department
Admission: EM | Admit: 2018-09-22 | Discharge: 2018-09-22 | Disposition: A | Discharge status: Home / Self Care | Payer: Self-pay | Attending: Emergency Medicine | Admitting: Emergency Medicine

## 2018-09-22 DIAGNOSIS — F17210 Nicotine dependence, cigarettes, uncomplicated: Secondary | ICD-10-CM | POA: Insufficient documentation

## 2018-09-22 DIAGNOSIS — W228XXA Striking against or struck by other objects, initial encounter: Secondary | ICD-10-CM | POA: Insufficient documentation

## 2018-09-22 DIAGNOSIS — Y929 Unspecified place or not applicable: Secondary | ICD-10-CM | POA: Insufficient documentation

## 2018-09-22 DIAGNOSIS — S8011XA Contusion of right lower leg, initial encounter: Secondary | ICD-10-CM | POA: Insufficient documentation

## 2018-09-22 DIAGNOSIS — Z79899 Other long term (current) drug therapy: Secondary | ICD-10-CM | POA: Insufficient documentation

## 2018-09-22 DIAGNOSIS — S86811A Strain of other muscle(s) and tendon(s) at lower leg level, right leg, initial encounter: Secondary | ICD-10-CM | POA: Insufficient documentation

## 2018-09-22 DIAGNOSIS — Y990 Civilian activity done for income or pay: Secondary | ICD-10-CM | POA: Insufficient documentation

## 2018-09-22 DIAGNOSIS — Y9389 Activity, other specified: Secondary | ICD-10-CM | POA: Insufficient documentation

## 2018-09-22 HISTORY — DX: Essential (primary) hypertension: I10

## 2018-09-22 MED ORDER — TRAMADOL HCL 50 MG PO TABS
50.0000 mg | ORAL_TABLET | Freq: Once | ORAL | Status: AC
  Administered 2018-09-22: 50 mg via ORAL
  Filled 2018-09-22: qty 1

## 2018-09-22 NOTE — ED Provider Notes (Signed)
[facility] EMERGENCY DEPARTMENT Provider Note   CSN: [id number] Arrival date & time: 09/22/18  1734     History   Chief Complaint Chief Complaint  Patient presents with  . Leg Injury    HPI Adriana Meza is a 48 y.o. female.  Presents to the emergency department for evaluation of right leg pain.  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  She developed some anterior leg pain and bruising but also felt a strain in her right calf at the same time her shin hit the bed.  She denies falling or injuring any other part of her body.  She is had slight increase in pain as the day has went on.  She denies any numbness or tingling.  She has been ambulatory with a limp.  She has not had a medications for pain.  Pain is 7 out of 10 located along the anterior proximal tibia.     HPI  Past Medical History:  Diagnosis Date  . Depression   . Hypertension     There are no active problems to display for this patient.   Past Surgical History:  Procedure Laterality Date  . CESAREAN SECTION    . TUBAL LIGATION       OB History   No obstetric history on file.      Home Medications    Prior to Admission medications   Medication Sig Start Date End Date Taking? Authorizing Provider  famotidine (PEPCID) 20 MG tablet Take 1 tablet (20 mg total) by mouth 2 (two) times daily. 05/01/17 05/01/18  Merrily Brittleifenbark, Neil, MD  HYDROcodone-acetaminophen (NORCO) 5-325 MG tablet Take 1 tablet by mouth every 6 (six) hours as needed for moderate pain. Patient not taking: Reported on 09/22/2018 12/08/17   Irean HongSung, Jade J, MD  ibuprofen (ADVIL,MOTRIN) 800 MG tablet Take 1 tablet (800 mg total) by mouth every 8 (eight) hours as needed. Patient not taking: Reported on 09/22/2018 01/02/17   Enid DerryWagner, Ashley, PA-C  pantoprazole (PROTONIX) 40 MG tablet Take 1 tablet (40 mg  total) by mouth daily. 05/01/17 05/01/18  Merrily Brittleifenbark, Neil, MD    Family History No family history on file.  Social History Social History   Tobacco Use  . Smoking status: Current Every Day Smoker    Packs/day: 0.50    Types: Cigarettes  . Smokeless tobacco: Never Used  Substance Use Topics  . Alcohol use: Yes  . Drug use: No     Allergies   Patient has no known allergies.   Review of Systems Review of Systems  Constitutional: Negative for fever.  Respiratory: Negative for shortness of breath.   Cardiovascular: Negative for chest pain.  Gastrointestinal: Negative for nausea and vomiting.  Musculoskeletal: Positive for gait problem and myalgias.  Skin: Negative for wound.     Physical Exam Updated Vital Signs BP (!) 182/87 (BP Location: Right Arm)   Pulse 65   Temp 99.3 F (37.4 C) (Oral)   Resp 16   Ht 5\' 2"  (1.575 m)   Wt 54.4 kg   SpO2 99%   BMI 21.95 kg/m   Physical Exam Constitutional:      Appearance: She is well-developed.  HENT:     Head: Normocephalic and atraumatic.  Eyes:     Conjunctiva/sclera: Conjunctivae normal.  Neck:     Musculoskeletal: Normal range of motion.  Cardiovascular:  Rate and Rhythm: Normal rate.  Pulmonary:     Effort: Pulmonary effort is normal. No respiratory distress.  Musculoskeletal: Normal range of motion.     Comments: Right lower extremity with normal range of motion of the hip knee and ankle.  She is able to straight leg raise.  Minimal ecchymosis to the anterior proximal tibia with no tenderness along patellar tendon.  No swelling anteriorly.  She has no calf swelling or edema.  She is able to ankle plantarflex and dorsiflex but has some pain with active and passive dorsiflexion of the ankle producing some mild calf pain.  Also painful resisted ankle plantarflexion.  Calf is soft.  She is neurovascular intact in right lower extremity.  She ambulates with a minimally antalgic gait.  Skin:    General: Skin is warm.      Findings: No rash.  Neurological:     Mental Status: She is alert and oriented to person, place, and time.  Psychiatric:        Behavior: Behavior normal.        Thought Content: Thought content normal.      ED Treatments / Results  Labs (all labs ordered are listed, but only abnormal results are displayed) Labs Reviewed - No data to display  EKG None  Radiology Dg Tibia/fibula Right  Result Date: 09/22/2018 CLINICAL DATA:  Right leg pain. EXAM: RIGHT TIBIA AND FIBULA - 2 VIEW COMPARISON:  None FINDINGS: There is no evidence of fracture or other focal bone lesions. Soft tissues are unremarkable. IMPRESSION: Negative. Electronically Signed   By: Kerby Moors M.D.   On: 09/22/2018 19:36    Procedures Procedures (including critical care time)  Medications Ordered in ED Medications  traMADol (ULTRAM) tablet 50 mg (50 mg Oral Given 09/22/18 1851)     Initial Impression / Assessment and Plan / ED Course  I have reviewed the triage vital signs and the nursing notes.  Pertinent labs & imaging results that were available during my care of the patient were reviewed by me and considered in my medical decision making (see chart for details).        48 year old female with contusion to the right lower extremity from hitting her shin onto the patient's medical bed.  Most of patient's pain seems to be coming from right calf strain as she called the patient to wait and put increased stress on patient's calf muscle.  She felt a strain in her calf and now has been painful resisted plantarflexion and painful passive dorsiflexion of the right ankle.  No significant swelling.  She is neurovascular intact in right lower extremity.  She is placed in Ace wrap, given ice pack.  She is given crutches.  She will call orthopedic schedule follow-up appointment.  She will take ibuprofen as needed for pain.  Final Clinical Impressions(s) / ED Diagnoses   Final diagnoses:  Strain of calf muscle,  right, initial encounter  Contusion of right lower leg, initial encounter    ED Discharge Orders    None       Renata Caprice 09/22/18 1947    Vanessa Lacon, MD 09/23/18 [phone]

## 2018-09-22 NOTE — Discharge Instructions (Signed)
Please use crutches as needed for ambulation until he can walk without a limp.  Call orthopedic office tomorrow morning to schedule follow-up appointment in the next week.  Rest ice and elevate the lower extremity.  Return to the ER for any worsening symptoms or urgent changes in health.

## 2018-09-22 NOTE — ED Triage Notes (Signed)
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 Pain worse with walking. No break in skin. Wrapped with ACE by pt PTA. Pt alert and oriented X4, cooperative, RR even and unlabored, color WNL. Pt in NAD.

## 2020-07-17 IMAGING — DX DG TIBIA/FIBULA 2V*R*
2 series · 2 of 2 positions shown · non-contrast
Comparison: None

CLINICAL DATA: Right leg pain.

EXAM:
RIGHT TIBIA AND FIBULA - 2 VIEW

[tibia ap]
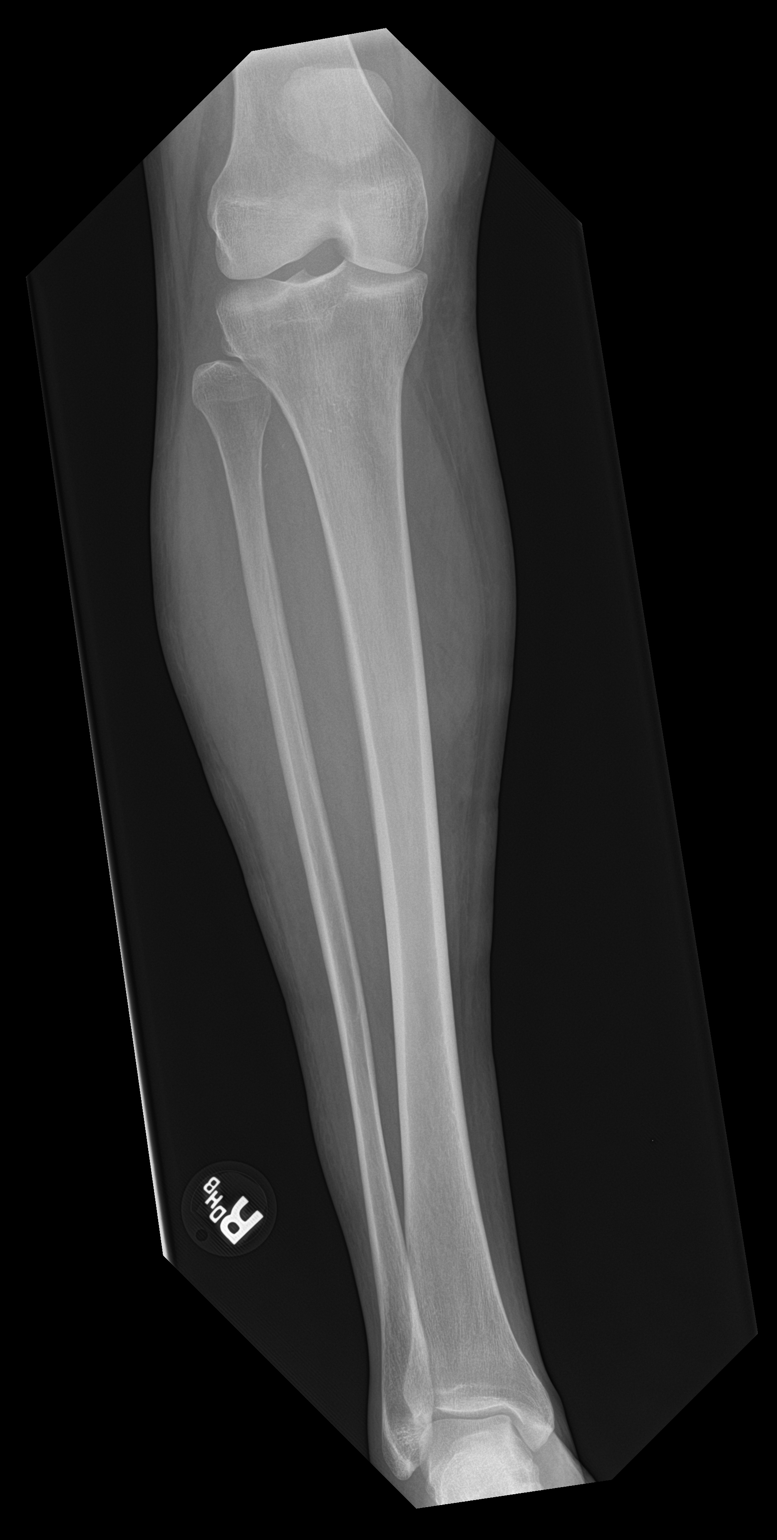

[tibia lat]
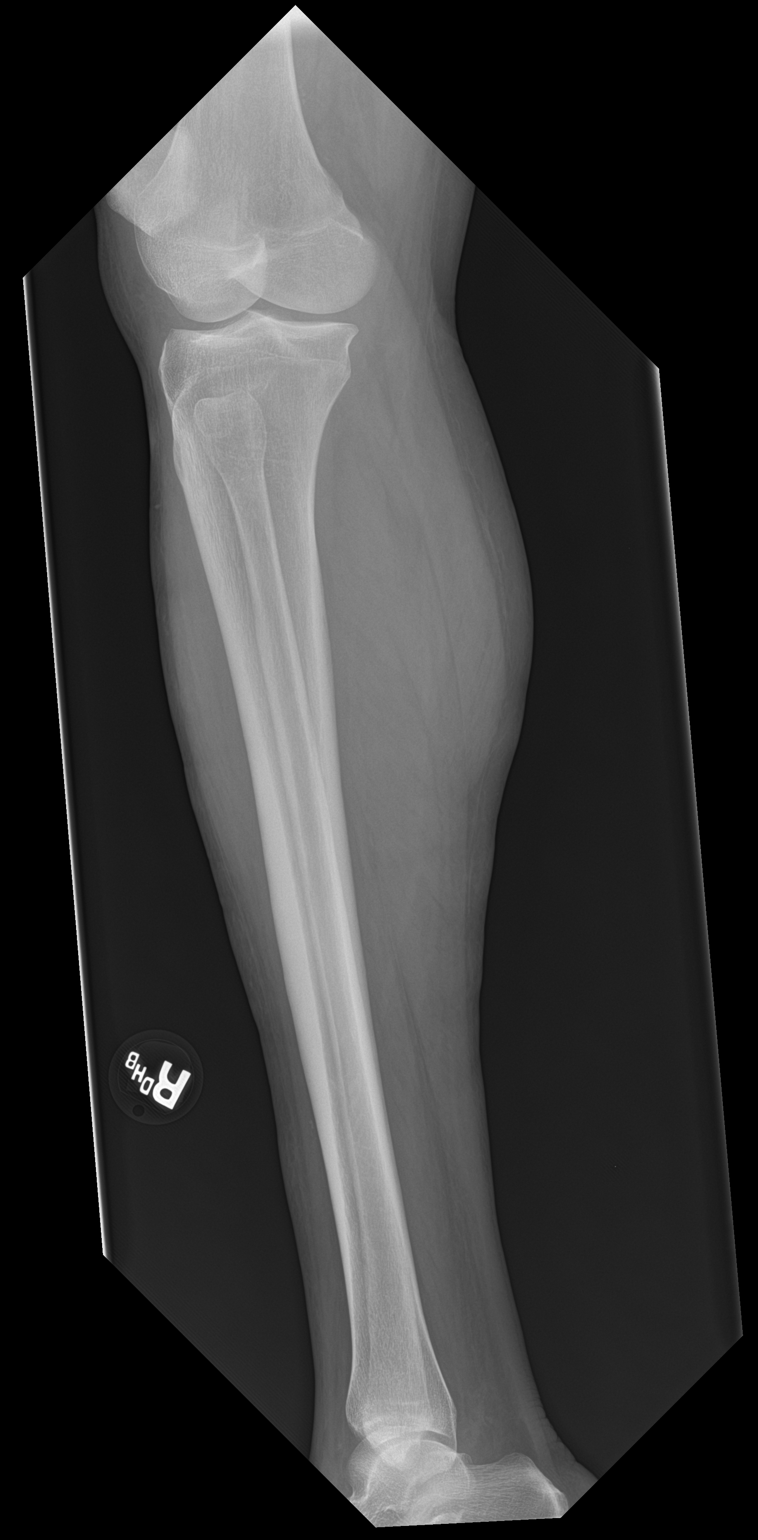

[2 of 2 positions shown; findings below may reference images not displayed]

FINDINGS: There is no evidence of fracture or other focal bone lesions. Soft
tissues are unremarkable.
IMPRESSION: Negative.

## 2023-06-28 ENCOUNTER — Other Ambulatory Visit: Payer: Self-pay

## 2023-06-28 ENCOUNTER — Emergency Department
Admission: EM | Admit: 2023-06-28 | Discharge: 2023-06-28 | Disposition: A | Discharge status: Home / Self Care | Payer: Self-pay | Attending: Emergency Medicine | Admitting: Emergency Medicine

## 2023-06-28 DIAGNOSIS — M542 Cervicalgia: Secondary | ICD-10-CM | POA: Insufficient documentation

## 2023-06-28 MED ORDER — LIDOCAINE 5 % EX PTCH
1.0000 | MEDICATED_PATCH | CUTANEOUS | Status: DC
  Administered 2023-06-28: 1 via TRANSDERMAL
  Filled 2023-06-28: qty 1

## 2023-06-28 MED ORDER — KETOROLAC TROMETHAMINE 15 MG/ML IJ SOLN
15.0000 mg | Freq: Once | INTRAMUSCULAR | Status: AC
  Administered 2023-06-28: 15 mg via INTRAMUSCULAR
  Filled 2023-06-28: qty 1

## 2023-06-28 MED ORDER — LIDOCAINE 5 % EX PTCH: 1.0000 | MEDICATED_PATCH | Freq: Two times a day (BID) | CUTANEOUS | 0 refills | Status: AC

## 2023-06-28 MED ORDER — NAPROXEN 500 MG PO TABS: 500.0000 mg | ORAL_TABLET | Freq: Two times a day (BID) | ORAL | 0 refills | Status: AC

## 2023-06-28 NOTE — ED Provider Notes (Signed)
 [facility] Provider Note    Event Date/Time   First MD Initiated Contact with Patient 06/28/23 [phone]     (approximate)   History   Neck Injury   HPI  Adriana Meza is a 53 y.o. female who presents today for evaluation of bilateral neck and shoulder pain.  Patient reports that she works at [employer] and has her head looking down all day and feels like this has tightened her neck muscles.  She denies any injury.  No headache.  No numbness or tingling or weakness in her arms or legs.  No dizziness or visual changes.  She is requesting a work note.  There are no active problems to display for this patient.         Physical Exam   Triage Vital Signs: ED Triage Vitals [06/28/23 0734]  Encounter Vitals Group     BP (!) 158/91     Girls Systolic BP Percentile      Girls Diastolic BP Percentile      Boys Systolic BP Percentile      Boys Diastolic BP Percentile      Pulse Rate 69     Resp 17     Temp 98 F (36.7 C)     Temp Source Oral     SpO2 99 %     Weight 125 lb (56.7 kg)     Height 5' 2 (1.575 m)     Head Circumference      Peak Flow      Pain Score 7     Pain Loc      Pain Education      Exclude from Growth Chart     Most recent vital signs: Vitals:   06/28/23 0734  BP: (!) 158/91  Pulse: 69  Resp: 17  Temp: 98 F (36.7 C)  SpO2: 99%    Physical Exam Vitals and nursing note reviewed.  Constitutional:      General: Awake and alert. No acute distress.    Appearance: Normal appearance. The patient is normal weight.  HENT:     Head: Normocephalic and atraumatic.     Mouth: Mucous membranes are moist.  Eyes:     General: PERRL. Normal EOMs        Right eye: No discharge.        Left eye: No discharge.     Conjunctiva/sclera: Conjunctivae normal.  Cardiovascular:     Rate and Rhythm: Normal rate and regular rhythm.     Pulses: Normal pulses.  Pulmonary:     Effort: Pulmonary effort is normal. No respiratory distress.      Breath sounds: Normal breath sounds.  Abdominal:     Abdomen is soft. There is no abdominal tenderness. No rebound or guarding. No distention. Musculoskeletal:        General: No swelling. Normal range of motion.     Cervical back: Normal range of motion and neck supple. No midline cervical spine tenderness.  Tenderness to bilateral trapezius muscle area.  Full range of motion of neck.  Negative Spurling test.  Negative Lhermitte sign.  Normal strength and sensation in bilateral upper extremities. Normal grip strength bilaterally.  Normal intrinsic muscle function of the hand bilaterally.  Normal radial pulses bilaterally. Skin:    General: Skin is warm and dry.     Capillary Refill: Capillary refill takes less than 2 seconds.     Findings: No rash.  Neurological:     Mental Status:  The patient is awake and alert.   Neurological: GCS 15 alert and oriented x3 Normal speech, no expressive or receptive aphasia or dysarthria Cranial nerves II through XII intact Normal visual fields 5 out of 5 strength in all 4 extremities with intact sensation throughout No extremity drift Normal finger-to-nose testing, no limb or truncal ataxia    ED Results / Procedures / Treatments   Labs (all labs ordered are listed, but only abnormal results are displayed) Labs Reviewed - No data to display   EKG     RADIOLOGY     PROCEDURES:  Critical Care performed:   Procedures   MEDICATIONS ORDERED IN ED: Medications  lidocaine  (LIDODERM ) 5 % 1 patch (1 patch Transdermal Patch Applied 06/28/23 0806)  ketorolac  (TORADOL ) 15 MG/ML injection 15 mg (15 mg Intramuscular Given 06/28/23 0809)     IMPRESSION / MDM / ASSESSMENT AND PLAN / ED COURSE  I reviewed the triage vital signs and the nursing notes.   Differential diagnosis includes, but is not limited to, muscle spasm, muscle strain, less likely radiculopathy or fracture.  Patient is awake and alert, hemodynamically stable and  afebrile.  She is tenderness palpation to her trapezius muscle area, no midline cervical spine tenderness.  No dizziness, visual changes, cranial nerve deficit to suggest vertebral or carotid artery dissection.  No recent neck manipulation, no trauma.  No paresthesias or weakness in her arms, normal strength and sensation throughout bilateral arms and normal intrinsic muscle function of bilateral hands, do not suspect radiculopathy or cord abnormality.  Symptoms and exam most consistent with trapezius muscle strain versus spasm.  She was treated with Toradol  and Lidoderm  patches.  This provided good relief.  She has full and normal range of motion of her neck.  We discussed return precautions and outpatient follow-up.  We also discussed symptomatic management.  Patient understands and agrees with plan.  She was discharged in stable condition.  She was given a work note as she requested.  Patient's presentation is most consistent with acute illness / injury with system symptoms.    FINAL CLINICAL IMPRESSION(S) / ED DIAGNOSES   Final diagnoses:  Neck pain, bilateral     Rx / DC Orders   ED Discharge Orders          Ordered    naproxen (NAPROSYN) 500 MG tablet  2 times daily with meals        06/28/23 0752    lidocaine  (LIDODERM ) 5 %  Every 12 hours        06/28/23 9247             Note:  This document was prepared using Dragon voice recognition software and may include unintentional dictation errors.   Charlene Cowdrey E, PA-C 06/28/23 9166    Willo Dunnings, MD 06/28/23 1536

## 2023-06-28 NOTE — ED Triage Notes (Signed)
 Pt sts that she has been having bilateral neck pain and upper shoulder pain for the last day. Pt sts that she has tried OTC meds as well as ice and nothing is working.

## 2023-06-28 NOTE — Discharge Instructions (Signed)
 You may take the medications as prescribed.  Please return for any new, worsening, or change in symptoms or other concerns.  It was a pleasure caring for you today.
